# Patient Record
Sex: Female | Born: 1985 | Race: Asian | Hispanic: No | Smoking: Never smoker
Health system: Southern US, Community
[De-identification: ages and names within clinical notes are randomized; demographics above are authoritative.]

## PROBLEM LIST (undated history)

## (undated) ENCOUNTER — Emergency Department (HOSPITAL_COMMUNITY): Admission: EM | Payer: Medicaid Other | Source: Home / Self Care

## (undated) DIAGNOSIS — K219 Gastro-esophageal reflux disease without esophagitis: Secondary | ICD-10-CM

## (undated) HISTORY — DX: Gastro-esophageal reflux disease without esophagitis: K21.9

## (undated) HISTORY — PX: BREAST SURGERY: SHX581

---

## 2017-10-06 DIAGNOSIS — Z3009 Encounter for other general counseling and advice on contraception: Secondary | ICD-10-CM | POA: Diagnosis not present

## 2017-10-06 DIAGNOSIS — Z111 Encounter for screening for respiratory tuberculosis: Secondary | ICD-10-CM | POA: Diagnosis not present

## 2017-10-06 DIAGNOSIS — Z01419 Encounter for gynecological examination (general) (routine) without abnormal findings: Secondary | ICD-10-CM | POA: Diagnosis not present

## 2017-10-06 DIAGNOSIS — Z049 Encounter for examination and observation for unspecified reason: Secondary | ICD-10-CM | POA: Diagnosis not present

## 2017-10-06 DIAGNOSIS — Z23 Encounter for immunization: Secondary | ICD-10-CM | POA: Diagnosis not present

## 2017-10-06 DIAGNOSIS — Z206 Contact with and (suspected) exposure to human immunodeficiency virus [HIV]: Secondary | ICD-10-CM | POA: Diagnosis not present

## 2017-10-06 DIAGNOSIS — Z0389 Encounter for observation for other suspected diseases and conditions ruled out: Secondary | ICD-10-CM | POA: Diagnosis not present

## 2017-10-06 DIAGNOSIS — Z1388 Encounter for screening for disorder due to exposure to contaminants: Secondary | ICD-10-CM | POA: Diagnosis not present

## 2017-10-06 DIAGNOSIS — R87610 Atypical squamous cells of undetermined significance on cytologic smear of cervix (ASC-US): Secondary | ICD-10-CM | POA: Diagnosis not present

## 2017-11-02 ENCOUNTER — Other Ambulatory Visit (HOSPITAL_COMMUNITY): Payer: Self-pay | Admitting: Family

## 2017-11-02 ENCOUNTER — Ambulatory Visit (HOSPITAL_COMMUNITY)
Admission: RE | Admit: 2017-11-02 | Discharge: 2017-11-02 | Disposition: A | Discharge status: Home / Self Care | Payer: Medicaid Other | Source: Ambulatory Visit | Attending: Family | Admitting: Family

## 2017-11-02 DIAGNOSIS — Z975 Presence of (intrauterine) contraceptive device: Secondary | ICD-10-CM

## 2017-11-02 DIAGNOSIS — Z30017 Encounter for initial prescription of implantable subdermal contraceptive: Secondary | ICD-10-CM | POA: Diagnosis not present

## 2017-11-02 DIAGNOSIS — Z3046 Encounter for surveillance of implantable subdermal contraceptive: Secondary | ICD-10-CM | POA: Diagnosis not present

## 2017-12-01 DIAGNOSIS — Z23 Encounter for immunization: Secondary | ICD-10-CM | POA: Diagnosis not present

## 2017-12-13 DIAGNOSIS — H6982 Other specified disorders of Eustachian tube, left ear: Secondary | ICD-10-CM | POA: Diagnosis not present

## 2017-12-13 DIAGNOSIS — H9312 Tinnitus, left ear: Secondary | ICD-10-CM | POA: Diagnosis not present

## 2017-12-13 DIAGNOSIS — Z0110 Encounter for examination of ears and hearing without abnormal findings: Secondary | ICD-10-CM | POA: Diagnosis not present

## 2017-12-13 DIAGNOSIS — H9319 Tinnitus, unspecified ear: Secondary | ICD-10-CM | POA: Diagnosis not present

## 2017-12-16 ENCOUNTER — Other Ambulatory Visit: Payer: Self-pay | Admitting: Otolaryngology

## 2017-12-16 DIAGNOSIS — H9319 Tinnitus, unspecified ear: Secondary | ICD-10-CM

## 2018-01-08 ENCOUNTER — Inpatient Hospital Stay (HOSPITAL_COMMUNITY): Admission: RE | Admit: 2018-01-08 | Payer: Medicaid Other | Source: Ambulatory Visit

## 2018-01-08 ENCOUNTER — Ambulatory Visit
Admission: RE | Admit: 2018-01-08 | Discharge: 2018-01-08 | Disposition: A | Discharge status: Home / Self Care | Payer: Medicaid Other | Source: Ambulatory Visit | Attending: Otolaryngology | Admitting: Otolaryngology

## 2018-01-08 DIAGNOSIS — H9319 Tinnitus, unspecified ear: Secondary | ICD-10-CM

## 2018-11-22 DIAGNOSIS — Z23 Encounter for immunization: Secondary | ICD-10-CM | POA: Diagnosis not present

## 2018-11-30 ENCOUNTER — Other Ambulatory Visit: Payer: Self-pay

## 2018-11-30 DIAGNOSIS — Z20822 Contact with and (suspected) exposure to covid-19: Secondary | ICD-10-CM

## 2018-12-02 LAB — NOVEL CORONAVIRUS, NAA: SARS-CoV-2, NAA: NOT DETECTED

## 2019-07-11 ENCOUNTER — Encounter: Payer: Self-pay | Admitting: *Deleted

## 2019-07-12 NOTE — Congregational Nurse Program (Signed)
  Dept: [place] Nurse Program Note  Date of Encounter: 07/11/2019  Past Medical History: No past medical history on file.  Encounter Details: CNP Questionnaire - 07/11/19 1800      Questionnaire   Patient Status  Refugee    Race  --    Location Patient Served At  Not Applicable   [facility]  --    Uninsured  Uninsured (Subsequent visits/quarter)    Food  No food insecurities    Housing/Utilities  Yes, have permanent housing    Transportation  No transportation needs    Interpersonal Safety  Yes, feel physically and emotionally safe where you currently live    Medication  No medication insecurities    Medical Provider  Yes    ED Visit Averted  Not Applicable    Life-Saving Intervention Made  Not Applicable     See in clinic today for screening.  BP 108/76 P 76.  She last saw MD a year ago and reports that she went to the health department.  She currently takes birth control pills.  She reports headaches associated with the use of birth control pills.  Encouraged to see MD yearly for follow up.  Karene Fry, RN, MSN, [place] Office [phone] Cell

## 2019-12-05 IMAGING — DX DG HUMERUS 2V *R*
2 series · 2 of 2 positions shown · non-contrast
Comparison: None.

CLINICAL DATA: Nexplanon placement.

EXAM:
RIGHT HUMERUS - 2+ VIEW

[w humerus ap right]
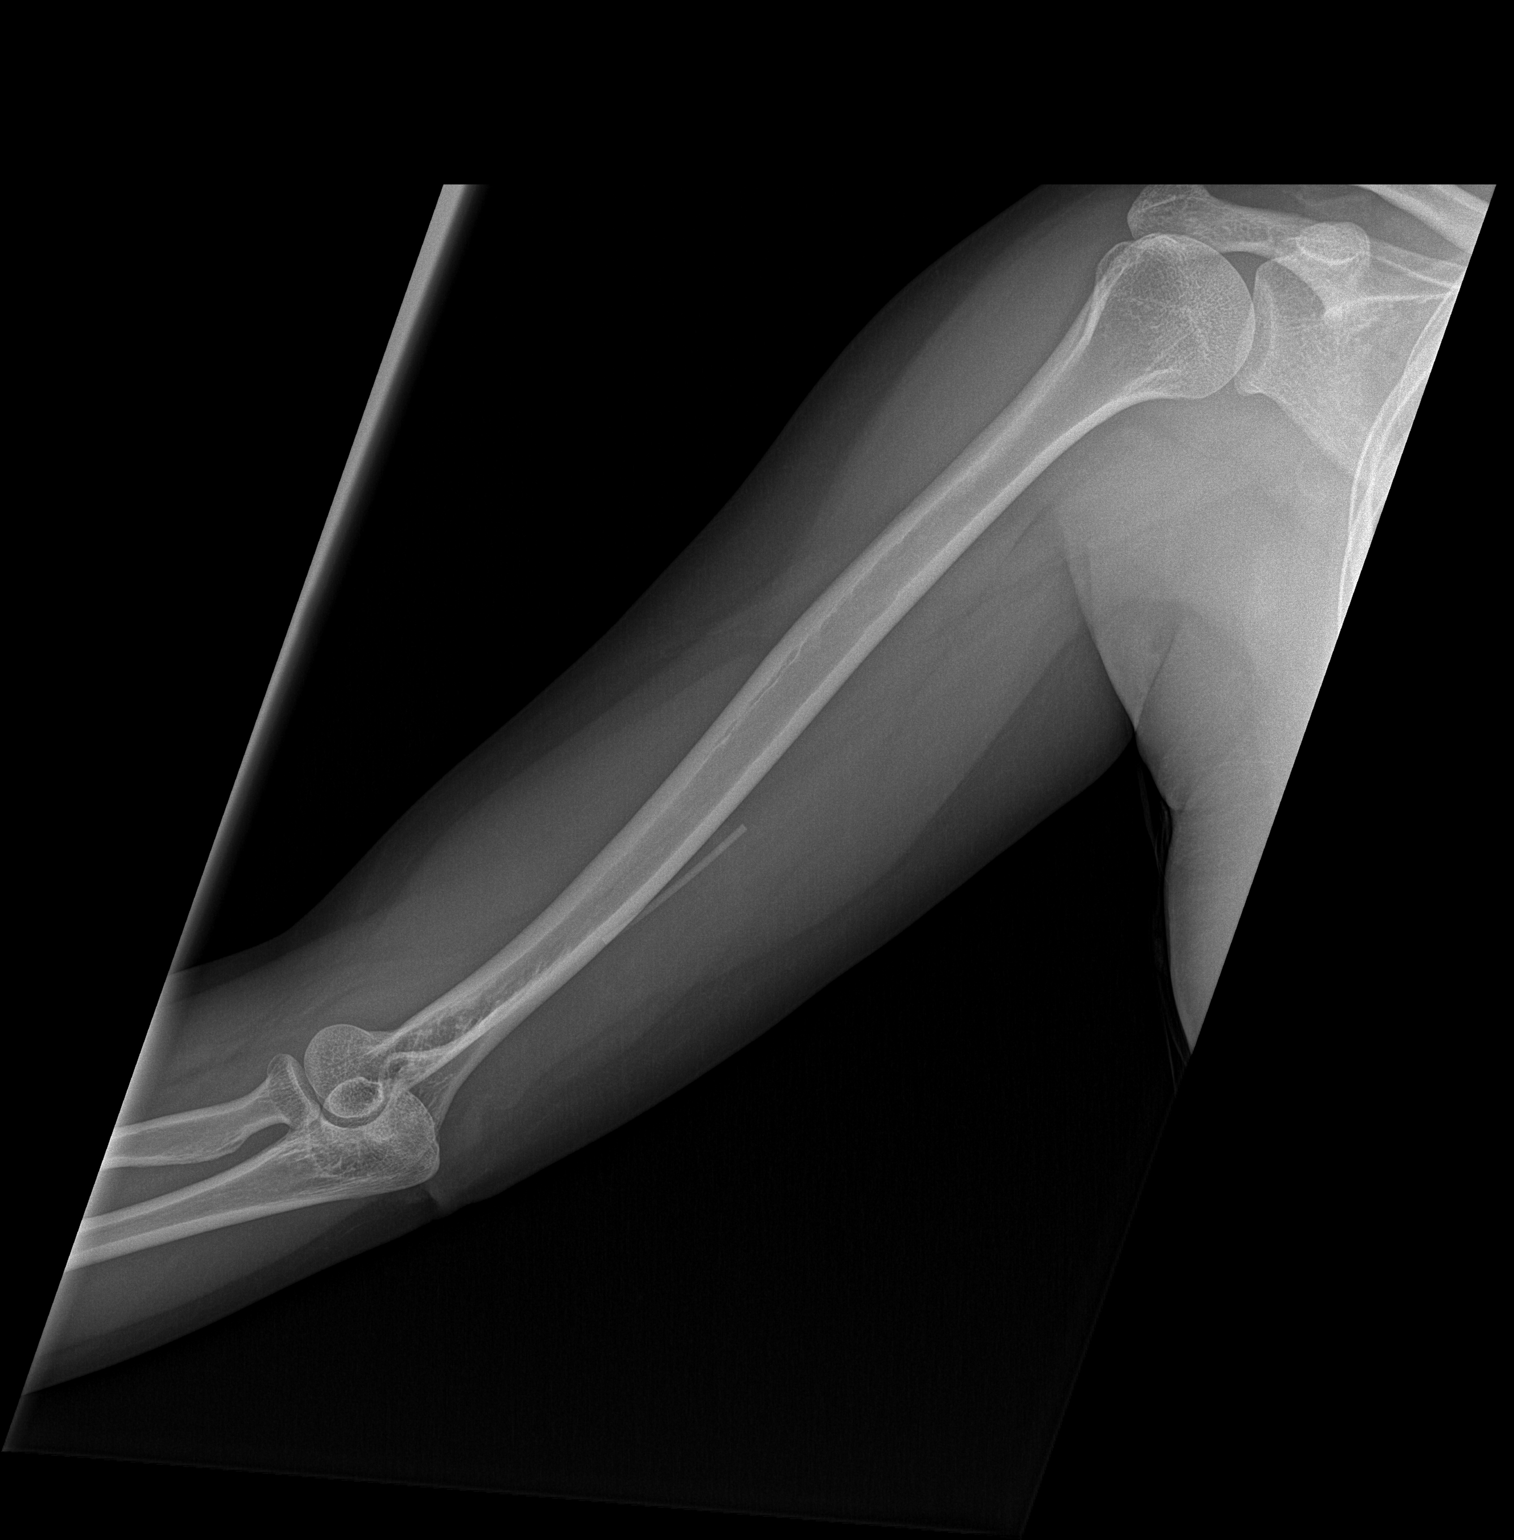

[w humerus lat right]
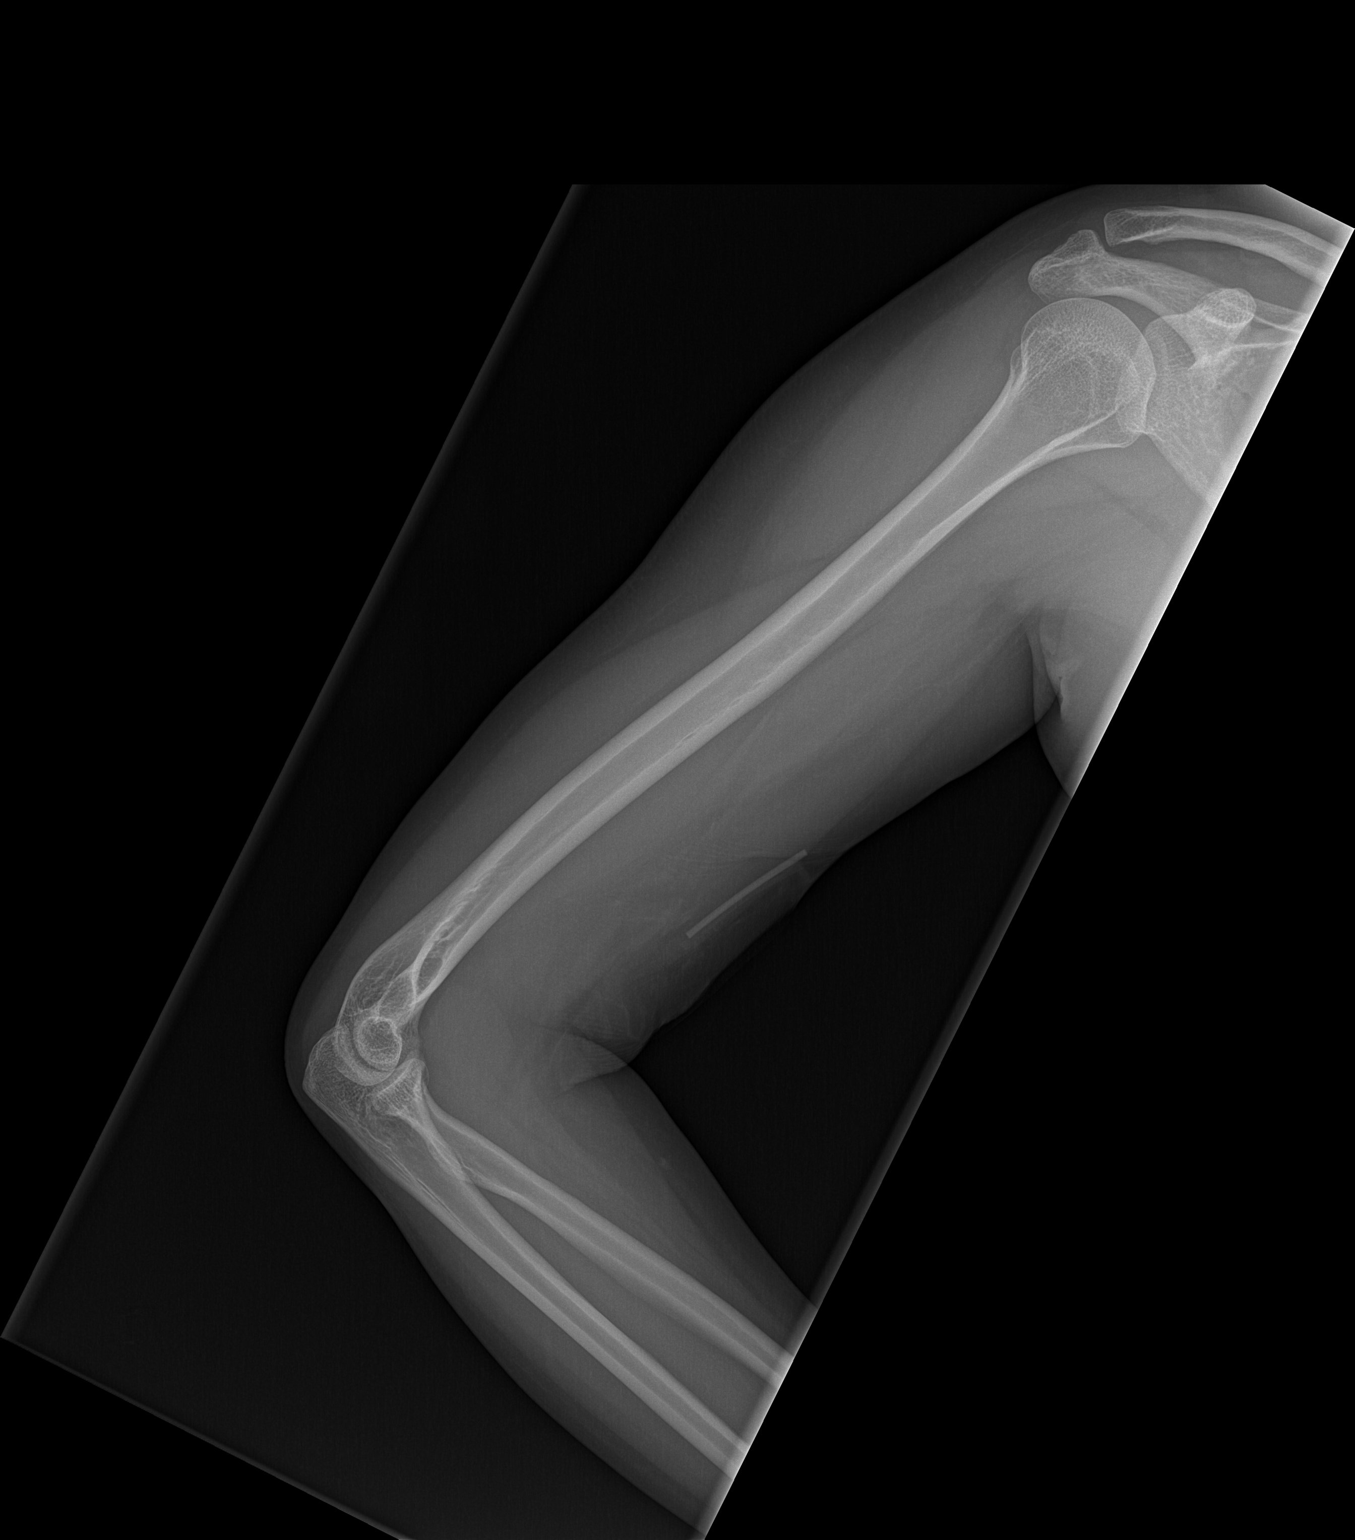

[2 of 2 positions shown; findings below may reference images not displayed]

FINDINGS: There is no evidence of fracture or other focal bone lesions. Linear
implant is seen in soft tissues of mid humeral region.
IMPRESSION: Normal right humerus. Nexplanon implant seen in mid humeral soft
tissues.

## 2020-08-05 DIAGNOSIS — R1013 Epigastric pain: Secondary | ICD-10-CM | POA: Diagnosis not present

## 2020-11-19 ENCOUNTER — Ambulatory Visit: Payer: Medicaid Other | Attending: Physician Assistant | Admitting: Physician Assistant

## 2020-11-19 ENCOUNTER — Encounter: Payer: Self-pay | Admitting: Physician Assistant

## 2020-11-19 ENCOUNTER — Other Ambulatory Visit: Payer: Self-pay

## 2020-11-19 VITALS — BP 112/78 | HR 76 | Temp 98.7°F | Resp 18 | Ht 62.0 in | Wt 123.0 lb

## 2020-11-19 DIAGNOSIS — Z6822 Body mass index (BMI) 22.0-22.9, adult: Secondary | ICD-10-CM

## 2020-11-19 DIAGNOSIS — Z1159 Encounter for screening for other viral diseases: Secondary | ICD-10-CM | POA: Diagnosis not present

## 2020-11-19 DIAGNOSIS — Z975 Presence of (intrauterine) contraceptive device: Secondary | ICD-10-CM | POA: Diagnosis not present

## 2020-11-19 DIAGNOSIS — D1722 Benign lipomatous neoplasm of skin and subcutaneous tissue of left arm: Secondary | ICD-10-CM

## 2020-11-19 DIAGNOSIS — Z13228 Encounter for screening for other metabolic disorders: Secondary | ICD-10-CM

## 2020-11-19 DIAGNOSIS — Z114 Encounter for screening for human immunodeficiency virus [HIV]: Secondary | ICD-10-CM

## 2020-11-19 NOTE — Patient Instructions (Signed)
We will call you with today's lab results.  I encourage you to use some warm compresses on the bump on your left forearm.  Kennieth Rad, PA-C Physician Assistant [email or web address]

## 2020-11-19 NOTE — Progress Notes (Signed)
New Patient Office Visit  Subjective:  Patient ID: Kim Mckinney, female    DOB: Jan 06, 1986  Age: 35 y.o. MRN: [id number]  CC:  Chief Complaint  Patient presents with   Establish Care    HPI Kim Mckinney states that she is new patient and would like to establish care.    States that she has a Nexplanon inserted 11/02/2017 and wishes it to be removed.  States that she has not had any issues with it.  Right upper arm  States that she has a bump on her left forearm, sometimes tender, has been present for several years.  States that she has not tried anything for relief. Denies injury/ trauma  Due to language barrier, an interpreter was present during the history-taking and subsequent discussion (and for part of the physical exam) with this patient.      Past Medical History:  Diagnosis Date   GERD (gastroesophageal reflux disease)     History reviewed. No pertinent surgical history.  History reviewed. No pertinent family history.  Social History   Socioeconomic History   Marital status: Married    Spouse name: Not on file   Number of children: 2   Years of education: Not on file   Highest education level: Not on file  Occupational History   Not on file  Tobacco Use   Smoking status: Never   Smokeless tobacco: Never  Substance and Sexual Activity   Alcohol use: Never   Drug use: Never   Sexual activity: Yes  Other Topics Concern   Not on file  Social History Narrative   Not on file   Social Determinants of Health   Financial Resource Strain: Not on file  Food Insecurity: Not on file  Transportation Needs: Not on file  Physical Activity: Not on file  Stress: Not on file  Social Connections: Not on file  Intimate Partner Violence: Not on file    ROS Review of Systems  Constitutional: Negative.   HENT: Negative.    Eyes: Negative.   Respiratory:  Negative for shortness of breath.   Cardiovascular:  Negative for chest pain.  Gastrointestinal: Negative.    Endocrine: Negative.   Genitourinary: Negative.   Musculoskeletal: Negative.   Skin: Negative.   Allergic/Immunologic: Negative.   Neurological: Negative.   Hematological: Negative.   Psychiatric/Behavioral: Negative.     Objective:   Today's Vitals: BP 112/78 (BP Location: Left Arm, Patient Position: Sitting, Cuff Size: Normal)   Pulse 76   Temp 98.7 F (37.1 C) (Oral)   Resp 18   Ht 5\' 2"  (1.575 m)   Wt 123 lb (55.8 kg)   SpO2 99%   BMI 22.50 kg/m   Physical Exam Vitals and nursing note reviewed.  Constitutional:      Appearance: Normal appearance.  HENT:     Head: Normocephalic and atraumatic.     Right Ear: External ear normal.     Left Ear: External ear normal.     Nose: Nose normal.     Mouth/Throat:     Mouth: Mucous membranes are moist.     Pharynx: Oropharynx is clear.  Eyes:     Extraocular Movements: Extraocular movements intact.     Conjunctiva/sclera: Conjunctivae normal.     Pupils: Pupils are equal, round, and reactive to light.  Cardiovascular:     Rate and Rhythm: Normal rate and regular rhythm.     Pulses: Normal pulses.     Heart sounds: Normal heart sounds.  Pulmonary:  Effort: Pulmonary effort is normal.     Breath sounds: Normal breath sounds.  Musculoskeletal:        General: Normal range of motion.     Cervical back: Normal range of motion and neck supple.  Skin:    General: Skin is warm and dry.  Neurological:     General: No focal deficit present.     Mental Status: She is alert and oriented to person, place, and time.  Psychiatric:        Mood and Affect: Mood normal.        Behavior: Behavior normal.        Thought Content: Thought content normal.        Judgment: Judgment normal.    Assessment & Plan:   Problem List Items Addressed This Visit       Other   Nexplanon in place - Primary   Relevant Orders   Ambulatory referral to Gynecology   Lipoma of left upper extremity   Other Visit Diagnoses     Screening for  metabolic disorder       Relevant Orders   CBC with Differential/Platelet (Completed)   Comp. Metabolic Panel (12) (Completed)   TSH (Completed)   Screening for HIV (human immunodeficiency virus)       Relevant Orders   HIV antibody (with reflex) (Completed)   Encounter for HCV screening test for low risk patient       Relevant Orders   HCV Ab w Reflex to Quant PCR (Completed)   BMI 22.0-22.9, adult           Outpatient Encounter Medications as of 11/19/2020  Medication Sig   omeprazole (PRILOSEC) 20 MG capsule Take 20 mg by mouth daily.   sucralfate (CARAFATE) 1 g tablet Take 1 g by mouth 4 (four) times daily.   No facility-administered encounter medications on file as of 11/19/2020.  1. Nexplanon in place Patient given appointment to establish care with [facility] provider [facility].  Patient given application for [place] financial assistance.  Due to Nexplanon being placed in [place], patient will be referred to gynecology for removal.  Patient was also given information for health department.  Patient understands and agrees. - Ambulatory referral to Gynecology  2. Lipoma of left upper extremity Patient education given on supportive care.  3. Screening for metabolic disorder  - CBC with Differential/Platelet - Comp. Metabolic Panel (12) - TSH  4. Screening for HIV (human immunodeficiency virus)  - HIV antibody (with reflex)  5. Encounter for HCV screening test for low risk patient  - HCV Ab w Reflex to Quant PCR  6. BMI 22.0-22.9, adult    I have reviewed the patient's medical history (PMH, PSH, Social History, Family History, Medications, and allergies) , and have been updated if relevant. I spent 30 minutes reviewing chart and  face to face time with patient.     Follow-up: No follow-ups on file.   Loraine Grip Mayers, PA-C

## 2020-11-19 NOTE — Progress Notes (Signed)
Patient has eaten today. Patient has not taken medication. Patient denies pain at this time.

## 2020-11-20 DIAGNOSIS — D1722 Benign lipomatous neoplasm of skin and subcutaneous tissue of left arm: Secondary | ICD-10-CM | POA: Insufficient documentation

## 2020-11-20 DIAGNOSIS — Z975 Presence of (intrauterine) contraceptive device: Secondary | ICD-10-CM | POA: Insufficient documentation

## 2020-11-20 LAB — CBC WITH DIFFERENTIAL/PLATELET
Basophils Absolute: 0.1 10*3/uL (ref 0.0–0.2)
Basos: 1 %
EOS (ABSOLUTE): 0 10*3/uL (ref 0.0–0.4)
Eos: 0 %
Hematocrit: 44.5 % (ref 34.0–46.6)
Hemoglobin: 14.4 g/dL (ref 11.1–15.9)
Immature Grans (Abs): 0.1 10*3/uL (ref 0.0–0.1)
Immature Granulocytes: 1 %
Lymphocytes Absolute: 1.1 10*3/uL (ref 0.7–3.1)
Lymphs: 15 %
MCH: 29.5 pg (ref 26.6–33.0)
MCHC: 32.4 g/dL (ref 31.5–35.7)
MCV: 91 fL (ref 79–97)
Monocytes Absolute: 0.5 10*3/uL (ref 0.1–0.9)
Monocytes: 7 %
Neutrophils Absolute: 5.6 10*3/uL (ref 1.4–7.0)
Neutrophils: 76 %
Platelets: 254 10*3/uL (ref 150–450)
RBC: 4.88 x10E6/uL (ref 3.77–5.28)
RDW: 11.9 % (ref 11.7–15.4)
WBC: 7.4 10*3/uL (ref 3.4–10.8)

## 2020-11-20 LAB — COMP. METABOLIC PANEL (12)
AST: 20 IU/L (ref 0–40)
Albumin/Globulin Ratio: 1.6 (ref 1.2–2.2)
Albumin: 5.1 g/dL — ABNORMAL HIGH (ref 3.8–4.8)
Alkaline Phosphatase: 72 IU/L (ref 44–121)
BUN/Creatinine Ratio: 17 (ref 9–23)
BUN: 14 mg/dL (ref 6–20)
Bilirubin Total: 0.4 mg/dL (ref 0.0–1.2)
Calcium: 9.6 mg/dL (ref 8.7–10.2)
Chloride: 101 mmol/L (ref 96–106)
Creatinine, Ser: 0.83 mg/dL (ref 0.57–1.00)
Globulin, Total: 3.1 g/dL (ref 1.5–4.5)
Glucose: 96 mg/dL (ref 70–99)
Potassium: 4.2 mmol/L (ref 3.5–5.2)
Sodium: 138 mmol/L (ref 134–144)
Total Protein: 8.2 g/dL (ref 6.0–8.5)
eGFR: 95 mL/min/{1.73_m2} (ref >59)

## 2020-11-20 LAB — HIV ANTIBODY (ROUTINE TESTING W REFLEX): HIV Screen 4th Generation wRfx: NONREACTIVE

## 2020-11-20 LAB — TSH: TSH: 2.66 u[IU]/mL (ref 0.450–4.500)

## 2020-11-20 LAB — HCV AB W REFLEX TO QUANT PCR: HCV Ab: <0.1 s/co ratio (ref 0.0–0.9)

## 2020-11-20 LAB — HCV INTERPRETATION

## 2020-12-05 ENCOUNTER — Telehealth: Payer: Self-pay | Admitting: *Deleted

## 2020-12-05 NOTE — Telephone Encounter (Signed)
-----   Message from Kennieth Rad, [place] sent at 11/20/2020  7:17 PM EDT ----- Please call patient and let her know that her thyroid function, kidney and liver function are within normal limits.  She does not show signs of anemia.  Her screening for hepatitis C and HIV were negative.

## 2020-12-05 NOTE — Telephone Encounter (Signed)
Medical Assistant left message on patient's home and cell voicemail. Voicemail states to give a call back to Caralynn Gelber with [facility] at [phone].  

## 2020-12-09 ENCOUNTER — Telehealth: Payer: Self-pay | Admitting: *Deleted

## 2020-12-09 NOTE — Telephone Encounter (Signed)
-----   Message from Kennieth Rad, [place] sent at 11/20/2020  7:17 PM EDT ----- Please call patient and let her know that her thyroid function, kidney and liver function are within normal limits.  She does not show signs of anemia.  Her screening for hepatitis C and HIV were negative.

## 2020-12-09 NOTE — Telephone Encounter (Signed)
Patient verified DOB Patient is aware of labs being normal and screenings being negative.

## 2021-02-05 ENCOUNTER — Ambulatory Visit (INDEPENDENT_AMBULATORY_CARE_PROVIDER_SITE_OTHER): Payer: Medicaid Other | Admitting: Obstetrics and Gynecology

## 2021-02-05 ENCOUNTER — Other Ambulatory Visit: Payer: Self-pay

## 2021-02-05 ENCOUNTER — Encounter: Payer: Self-pay | Admitting: Obstetrics and Gynecology

## 2021-02-05 VITALS — BP 112/75 | HR 82 | Temp 98.6°F | Wt 120.2 lb

## 2021-02-05 DIAGNOSIS — Z789 Other specified health status: Secondary | ICD-10-CM

## 2021-02-05 DIAGNOSIS — Z3046 Encounter for surveillance of implantable subdermal contraceptive: Secondary | ICD-10-CM

## 2021-02-08 ENCOUNTER — Encounter: Payer: Self-pay | Admitting: Obstetrics and Gynecology

## 2021-02-08 NOTE — Progress Notes (Signed)
[name] is a G2P0 female in the office today for removal of Nexplanon; which was inserted 11/02/2017. She does not desire to have it replaced today. She has no complaints today.  BP 112/75    Pulse 82    Temp 98.6 F (37 C)    Wt 120 lb 3.2 oz (54.5 kg)    BMI 21.98 kg/m    Procedure Note: Consent obtained and Time-Out conducted Implant palpated in left upper arm Betadine prep done on area of excision/removal Lidocaine infiltrated into intradermal and subcutaneous space Small 76mm incision made with scalpel Pressure applied to distal end of implant which exposed the tip through incision Tip of implant grasped with hemostat There was some adherence of implant in subcutaneous tissue. Twisting and manipulation freed the implant from the capsule Two (2) Implants removed >>patient unaware there were 2 implants  Pressure held on incision until bleeding stopped Steristrips applied to incision Pressure dressing applied by RN Patient tolerated procedure well.   Assessment and Plan: 1. Encounter for surveillance of implantable subdermal contraceptive 2. Language barrier affecting health care  - [employer] Audio Burmese Interpreter, attempted for portion of visit; audio had a bad connection, so sister utilized for completion of visit. Ok per patient.  Time spent with patient doing procedure 15 minutes. There was 5 minutes of chart review time spent prior to this encounter. Total time spent = 20 minutes.   Laury Deep, CNM  02/04/2021 3:00 PM

## 2021-09-03 ENCOUNTER — Other Ambulatory Visit: Payer: Self-pay

## 2021-09-03 ENCOUNTER — Ambulatory Visit: Payer: Medicaid Other | Attending: Internal Medicine | Admitting: Internal Medicine

## 2021-09-03 ENCOUNTER — Encounter: Payer: Self-pay | Admitting: Internal Medicine

## 2021-09-03 VITALS — BP 112/78 | HR 80 | Ht 62.0 in | Wt 121.2 lb

## 2021-09-03 DIAGNOSIS — B353 Tinea pedis: Secondary | ICD-10-CM | POA: Insufficient documentation

## 2021-09-03 DIAGNOSIS — K5901 Slow transit constipation: Secondary | ICD-10-CM

## 2021-09-03 DIAGNOSIS — Z7689 Persons encountering health services in other specified circumstances: Secondary | ICD-10-CM | POA: Diagnosis not present

## 2021-09-03 DIAGNOSIS — K219 Gastro-esophageal reflux disease without esophagitis: Secondary | ICD-10-CM

## 2021-09-03 DIAGNOSIS — Z23 Encounter for immunization: Secondary | ICD-10-CM

## 2021-09-03 MED ORDER — POLYETHYLENE GLYCOL 3350 17 G PO PACK: 17.0000 g | PACK | Freq: Every day | ORAL | 1 refills | Status: AC | PRN

## 2021-09-03 MED ORDER — OMEPRAZOLE 20 MG PO CPDR: 20.0000 mg | DELAYED_RELEASE_CAPSULE | Freq: Every day | ORAL | 3 refills | Status: AC

## 2021-09-04 ENCOUNTER — Encounter: Payer: Self-pay | Admitting: Obstetrics and Gynecology

## 2021-10-16 ENCOUNTER — Ambulatory Visit: Payer: Medicaid Other | Admitting: Internal Medicine

## 2022-06-29 ENCOUNTER — Other Ambulatory Visit (HOSPITAL_COMMUNITY)
Admission: RE | Admit: 2022-06-29 | Discharge: 2022-06-29 | Disposition: A | Discharge status: Home / Self Care | Payer: Self-pay | Source: Ambulatory Visit | Attending: Advanced Practice Midwife | Admitting: Advanced Practice Midwife

## 2022-06-29 ENCOUNTER — Encounter: Payer: Self-pay | Admitting: Advanced Practice Midwife

## 2022-06-29 ENCOUNTER — Ambulatory Visit (INDEPENDENT_AMBULATORY_CARE_PROVIDER_SITE_OTHER): Payer: Self-pay | Admitting: Advanced Practice Midwife

## 2022-06-29 VITALS — BP 111/76 | HR 92 | Ht 62.0 in | Wt 120.8 lb

## 2022-06-29 DIAGNOSIS — R300 Dysuria: Secondary | ICD-10-CM

## 2022-06-29 DIAGNOSIS — N898 Other specified noninflammatory disorders of vagina: Secondary | ICD-10-CM

## 2022-06-29 DIAGNOSIS — Z01419 Encounter for gynecological examination (general) (routine) without abnormal findings: Secondary | ICD-10-CM

## 2022-06-29 NOTE — Progress Notes (Signed)
NGYN presents AEX.  Last PAP 5 years ago. Requesting STD testing. Declines BC

## 2022-06-29 NOTE — Progress Notes (Signed)
   Subjective:     Kim Mckinney is a 37 y.o. female here at [facility] for a routine exam.  Current complaints: vaginal discharge, irritation, and dysuria x 2 months.  She desires STD and other infection testing.  Personal health questionnaire reviewed: yes.  Do you have a primary care provider? yes Do you feel safe at home? no  [employer] Visit from 11/19/2020 in [facility]  PHQ-2 Total Score 0       Health Maintenance Due  Topic Date Due   COVID-19 Vaccine (1) Never done   PAP SMEAR-Modifier  Never done     Risk factors for chronic health problems: Smoking: Alchohol/how much: Pt BMI: Body mass index is 22.09 kg/m.   Gynecologic History No LMP recorded. Contraception: none Last Pap: 2019 per pt. Results were: normal Last mammogram: n/a.   Obstetric History OB History  Gravida Para Term Preterm AB Living  2 0 0 0 0 2  SAB IAB Ectopic Multiple Live Births  0 0 0   2    # Outcome Date GA Lbr Len/2nd Weight Sex Delivery Anes PTL Lv  2 Gravida 04/18/11    M Vag-Spont   LIV  1 Gravida 08/19/05    F Vag-Spont   LIV     The following portions of the patient's history were reviewed and updated as appropriate: allergies, current medications, past family history, past medical history, past social history, past surgical history, and problem list.  Review of Systems Pertinent items noted in HPI and remainder of comprehensive ROS otherwise negative.    Objective:  BP 111/76   Pulse 92   Ht 5\' 2"  (1.575 m)   Wt 120 lb 12.8 oz (54.8 kg)   BMI 22.09 kg/m   VS reviewed, nursing note reviewed,  Constitutional: well developed, well nourished, no distress HEENT: normocephalic, thyroid without enlargement or mass HEART: RRR, no murmurs rubs/gallops RESP: clear and equal to auscultation bilaterally in all lobes  Breast Exam: exam performed: right breast normal without mass, skin or nipple changes or axillary nodes, left breast normal  without mass, skin or nipple changes or axillary nodes Abdomen: soft Neuro: alert and oriented x 3 Skin: warm, dry Psych: affect normal Pelvic exam: external evaluation with no erythema, edema or other abnormalities.  SSE deferred. Bimanual exam: Cervix 0/long/high, firm, anterior, neg CMT, uterus nontender, nonenlarged, adnexa without tenderness, enlargement, or mass        Assessment/Plan:   1. Vaginal irritation  - Cervicovaginal ancillary only( [place])  2. Dysuria  - Urine Culture  3. Encounter for annual routine gynecological examination --Doing well overall, due for Pap and with symptoms as listed. --Discussed costs today as pt is self-pay and pt opts to collect cervicovaginal swab and urine testing in office today but will follow up with Pap with [facility] or after receiving [facility] discount.   --Messages sent to set up with [facility] and attempt to assist with financial assistance paperwork with Burmese interpreter.        Return in about 1 year (around 06/29/2023) for annual exam, Pap with [facility] or in our office with [facility] financial assistance.   Sharen Counter, CNM 1:26 PM

## 2022-06-30 LAB — CERVICOVAGINAL ANCILLARY ONLY
Bacterial Vaginitis (gardnerella): NEGATIVE
Candida Glabrata: NEGATIVE
Candida Vaginitis: NEGATIVE
Chlamydia: NEGATIVE
Comment: NEGATIVE
Comment: NEGATIVE
Comment: NEGATIVE
Comment: NEGATIVE
Comment: NEGATIVE
Comment: NORMAL
Neisseria Gonorrhea: NEGATIVE
Trichomonas: NEGATIVE

## 2022-07-02 ENCOUNTER — Telehealth: Payer: Self-pay

## 2022-07-02 NOTE — Telephone Encounter (Signed)
Telephoned patient using interpreter#[id number]. Patient declined cervical screening with [facility] program.

## 2022-07-05 LAB — URINE CULTURE

## 2022-07-06 MED ORDER — SULFAMETHOXAZOLE-TRIMETHOPRIM 800-160 MG PO TABS: 1.0000 | ORAL_TABLET | Freq: Two times a day (BID) | ORAL | 0 refills | Status: AC

## 2022-07-06 NOTE — Addendum Note (Signed)
Addended by: Sharen Counter A on: 07/06/2022 10:15 AM   Modules accepted: Orders

## 2022-07-08 NOTE — Telephone Encounter (Signed)
Telephoned patient at mobile and home number using interpreter. Left a voice message with [facility] scheduling contact information.

## 2024-01-16 ENCOUNTER — Other Ambulatory Visit (HOSPITAL_COMMUNITY)
Admission: RE | Admit: 2024-01-16 | Discharge: 2024-01-16 | Disposition: A | Discharge status: Home / Self Care | Payer: Self-pay | Source: Ambulatory Visit | Attending: Internal Medicine | Admitting: Internal Medicine

## 2024-01-16 ENCOUNTER — Ambulatory Visit: Payer: Self-pay | Attending: Internal Medicine | Admitting: Internal Medicine

## 2024-01-16 ENCOUNTER — Encounter: Payer: Self-pay | Admitting: Internal Medicine

## 2024-01-16 VITALS — BP 97/64 | HR 75 | Temp 98.2°F | Ht 62.0 in | Wt 122.0 lb

## 2024-01-16 DIAGNOSIS — R079 Chest pain, unspecified: Secondary | ICD-10-CM

## 2024-01-16 DIAGNOSIS — H547 Unspecified visual loss: Secondary | ICD-10-CM

## 2024-01-16 DIAGNOSIS — Z0000 Encounter for general adult medical examination without abnormal findings: Secondary | ICD-10-CM

## 2024-01-16 DIAGNOSIS — Z124 Encounter for screening for malignant neoplasm of cervix: Secondary | ICD-10-CM

## 2024-01-16 DIAGNOSIS — Z2821 Immunization not carried out because of patient refusal: Secondary | ICD-10-CM

## 2024-01-16 DIAGNOSIS — G8929 Other chronic pain: Secondary | ICD-10-CM

## 2024-01-16 NOTE — Progress Notes (Signed)
 Patient ID: Kim Mckinney, female    DOB: 1985/11/19  MRN: [id number]  CC: Annual Exam (Physical. /No questions / concerns/No to flu vax)   Subjective: Kim Mckinney is a 38 y.o. female who presents for physical. Her concerns today include:  Pt with hx of GERD, constipation, tinea pedis  [employer] Language interpreter used during this encounter. #[id number], [id number]   Discussed the use of AI scribe software for clinical note transcription with the patient, who gave verbal consent to proceed.  History of Present Illness Kim Mckinney is a 38 year old female who presents for an annual physical exam.  She experiences chest pain three months ago, particularly when feeling tired at work.   Reports BL knee pain. The pain occurs almost daily and has been present for about six to seven months, worsening after sitting for a while and then getting up. Wprks 10 hrs 5 days a wk and 8 hrs on Saturdays. Work is sedentary working on a dealer.  She reports no problems with hearing, although she feels her hearing is not as sharp as before, which her husband sometimes notices.   She has difficulty seeing clearly when driving at night and has not had a recent eye exam. She does not have routine dental cleanings and has not seen a dentist since moving to the area.  Her diet includes daily vegetables and meat, and she drinks one cup of coffee daily. She does not consume sugary drinks or sodas. She does not engage in regular exercise due to fatigue from work and household responsibilities.  GYN History:  Pt is G2P2 Any hx of abn paps?: no Menses regular or irregular?: yes  How long does menses last? 3 days Menstrual flow light or heavy?: dark and clotting but light Method of birth control?:  no. Last cycle 12/19/23 Any vaginal dischg at this time?: sometimes has white itchy vaginal dischg but not currently. Dysuria?: no Any hx of STI?: no Sexually active with how many partners: her husband only Desires STI screen:  Last  MMG: NA Family hx of uterine, cervical or breast cancer?:     Patient Active Problem List   Diagnosis Date Noted   Gastroesophageal reflux disease without esophagitis 09/03/2021   Slow transit constipation 09/03/2021   Tinea pedis of both feet 09/03/2021   Lipoma of left upper extremity 11/20/2020     Current Outpatient Medications on File Prior to Visit  Medication Sig Dispense Refill   omeprazole  (PRILOSEC) 20 MG capsule Take 20 mg by mouth daily. (Patient not taking: Reported on 01/16/2024)     omeprazole  (PRILOSEC) 20 MG capsule Take 1 capsule (20 mg total) by mouth daily. (Patient not taking: Reported on 01/16/2024) 30 capsule 3   polyethylene glycol (MIRALAX ) 17 g packet Take 17 g by mouth daily as needed. (Patient not taking: Reported on 01/16/2024) 30 each 1   sucralfate (CARAFATE) 1 g tablet Take 1 g by mouth 4 (four) times daily. (Patient not taking: Reported on 01/16/2024)     No current facility-administered medications on file prior to visit.    No Known Allergies  Social History   Socioeconomic History   Marital status: Significant Other    Spouse name: Not on file   Number of children: 2   Years of education: Not on file   Highest education level: 7th grade  Occupational History   Occupation: Semstress  Tobacco Use   Smoking status: Never    Passive exposure: Never   Smokeless  tobacco: Never  Vaping Use   Vaping status: Never Used  Substance and Sexual Activity   Alcohol use: Not Currently   Drug use: Never   Sexual activity: Yes    Birth control/protection: None    Comment: Nexplanon  removed 02/05/2021  Other Topics Concern   Not on file  Social History Narrative   ** Merged History Encounter **       Social Drivers of Corporate Investment Banker Strain: Not on file  Food Insecurity: Not on file  Transportation Needs: Not on file  Physical Activity: Not on file  Stress: Not on file  Social Connections: Not on file  Intimate Partner Violence: Not  on file    No family history on file.  Past Surgical History:  Procedure Laterality Date   BREAST SURGERY      ROS: Review of Systems  HENT:  Negative for congestion and trouble swallowing.   Respiratory:  Negative for cough and shortness of breath.   Cardiovascular:  Negative for leg swelling.  Gastrointestinal:  Negative for abdominal pain and blood in stool.  Genitourinary:  Negative for difficulty urinating.   Negative except as stated above  PHYSICAL EXAM: BP 97/64 (BP Location: Left Arm, Patient Position: Sitting, Cuff Size: Normal)   Pulse 75   Temp 98.2 F (36.8 C) (Oral)   Ht 5' 2 (1.575 m)   Wt 122 lb (55.3 kg)   SpO2 98%   BMI 22.31 kg/m   Wt Readings from Last 3 Encounters:  01/16/24 122 lb (55.3 kg)  06/29/22 120 lb 12.8 oz (54.8 kg)  09/03/21 121 lb 3.2 oz (55 kg)    Physical Exam  General appearance - alert, well appearing, and in no distress Mental status - normal mood, behavior, speech, dress, motor activity, and thought processes Eyes - pupils equal and reactive, extraocular eye movements intact Ears - bilateral TM's and external ear canals normal Nose - normal and patent, no erythema, discharge or polyps Mouth - mucous membranes moist, pharynx normal without lesions; teeth stained brown Neck - supple, no significant adenopathy Lymphatics - no palpable lymphadenopathy, no hepatosplenomegaly Chest - clear to auscultation, no wheezes, rales or rhonchi, symmetric air entry Heart - normal rate, regular rhythm, normal S1, S2, no murmurs, rubs, clicks or gallops Abdomen - soft, nontender, nondistended, no masses or organomegaly Breasts - breasts appear normal, no suspicious masses, no skin or nipple changes or axillary nodes Pelvic - normal external genitalia, vulva, vagina, cervix, uterus and adnexa Musculoskeletal - knees: Slight joint enlargement.  She has good range of motion with mild crepitus on passive range of motion of the right  knee. Extremities -no lower extremity edema Skin - normal coloration and turgor, no rashes, no suspicious skin lesions noted     01/16/2024   11:53 AM 11/19/2020    2:04 PM  Depression screen PHQ 2/9  Decreased Interest 0 0  Down, Depressed, Hopeless 2 0  PHQ - 2 Score 2 0  Altered sleeping 0   Tired, decreased energy 3   Change in appetite 0   Feeling bad or failure about yourself  0   Trouble concentrating 0   Moving slowly or fidgety/restless 0   Suicidal thoughts 0   PHQ-9 Score 5   Difficult doing work/chores Not difficult at all        Latest Ref Rng & Units 11/19/2020    2:58 PM  CMP  Glucose 70 - 99 mg/dL 96   BUN 6 -  20 mg/dL 14   Creatinine 9.42 - 1.00 mg/dL 9.16   Sodium 865 - 855 mmol/L 138   Potassium 3.5 - 5.2 mmol/L 4.2   Chloride 96 - 106 mmol/L 101   Calcium 8.7 - 10.2 mg/dL 9.6   Total Protein 6.0 - 8.5 g/dL 8.2   Total Bilirubin 0.0 - 1.2 mg/dL 0.4   Alkaline Phos 44 - 121 IU/L 72   AST 0 - 40 IU/L 20    Lipid Panel  No results found for: CHOL, TRIG, HDL, CHOLHDL, VLDL, LDLCALC, LDLDIRECT  CBC    Component Value Date/Time   WBC 7.4 11/19/2020 1458   RBC 4.88 11/19/2020 1458   HGB 14.4 11/19/2020 1458   HCT 44.5 11/19/2020 1458   PLT 254 11/19/2020 1458   MCV 91 11/19/2020 1458   MCH 29.5 11/19/2020 1458   MCHC 32.4 11/19/2020 1458   RDW 11.9 11/19/2020 1458   LYMPHSABS 1.1 11/19/2020 1458   EOSABS 0.0 11/19/2020 1458   BASOSABS 0.1 11/19/2020 1458    ASSESSMENT AND PLAN: 1. Annual physical exam (Primary) Routine wellness visit focused on health maintenance and lifestyle modifications. - Performed Pap smear for cervical cancer screening. - Ordered baseline blood tests: blood count, kidney function, liver function, cholesterol. - Encouraged healthy eating: reduce sugary drinks, portion sizes of white carbohydrates; increase fresh fruits, vegetables, lean meats. - Advised daily exercise: park further away, take stairs. -  Encouraged routine dental cleanings twice a year.  - CBC - Comprehensive metabolic panel with GFR - Lipid panel  2. Pap smear for cervical cancer screening - Cytology - PAP([place])  3. Poor vision Encouraged her to get an eye exam done.  Probably most cost-effective place to have 1 done [facility] at [facility].  4. Chronic pain of both knees Could be early arthritis. Recommend using Tylenol over-the-counter as needed.  5. Chest pain, unspecified type Let us  know if she has any further episodes  6. Influenza vaccination declined  Patient was given the opportunity to ask questions.  Patient verbalized understanding of the plan and was able to repeat key elements of the plan.   This documentation was completed using Paediatric nurse.  Any transcriptional errors are unintentional.  Orders Placed This Encounter  Procedures   CBC   Comprehensive metabolic panel with GFR   Lipid panel     Requested Prescriptions    No prescriptions requested or ordered in this encounter    Return if symptoms worsen or fail to improve.  Barnie Louder, MD, FACP

## 2024-01-16 NOTE — Patient Instructions (Signed)
  VISIT SUMMARY: You came in today for your annual physical exam. We discussed your overall health, including your menstrual cycle, chest pain, and some issues with vision and hearing. We also talked about your diet, exercise habits, and the importance of routine dental cleanings.  YOUR PLAN: -WOMAN'S WELLNESS VISIT: This visit focused on maintaining your overall health and making lifestyle changes. We performed a Pap smear to screen for cervical cancer and ordered blood tests to check your blood count, kidney function, liver function, and cholesterol levels. You were advised to eat healthily by reducing sugary drinks and portion sizes of white carbohydrates, and increasing fresh fruits, vegetables, and lean meats. Daily exercise was encouraged, such as parking further away and taking the stairs. Routine dental cleanings twice a year were also recommended.  -CHRONIC BILATERAL KNEE PAIN: Your knee pain is likely due to prolonged sitting at work. You were advised to take over-the-counter Tylenol for pain relief, take breaks to move around during work, and bend your legs intermittently while sitting.  -CHEST PAIN: You have been experiencing intermittent chest pain for the past 6-7 months, which worsens with fatigue and prolonged sitting. You were advised to monitor the chest pain and report any further episodes.  -VISUAL IMPAIRMENT: You have difficulty seeing clearly at night while driving. You were recommended to get an eye exam and suggested [facility] for cost-effective options.  INSTRUCTIONS: Please follow up with the recommended blood tests and schedule an eye exam. Monitor your chest pain and report any further episodes. Make sure to take breaks and move around during work to help with your knee pain. Schedule routine dental cleanings twice a year.                      Contains text generated by [employer].                                  Contains text generated by [employer].

## 2024-01-17 ENCOUNTER — Ambulatory Visit: Payer: Self-pay | Admitting: Internal Medicine

## 2024-01-17 LAB — COMPREHENSIVE METABOLIC PANEL WITH GFR
ALT: 13 IU/L (ref 0–32)
AST: 20 IU/L (ref 0–40)
Albumin: 4.5 g/dL (ref 3.9–4.9)
Alkaline Phosphatase: 59 IU/L (ref 41–116)
BUN/Creatinine Ratio: 26 — ABNORMAL HIGH (ref 9–23)
BUN: 17 mg/dL (ref 6–20)
Bilirubin Total: 0.5 mg/dL (ref 0.0–1.2)
CO2: 24 mmol/L (ref 20–29)
Calcium: 9.5 mg/dL (ref 8.7–10.2)
Chloride: 102 mmol/L (ref 96–106)
Creatinine, Ser: 0.65 mg/dL (ref 0.57–1.00)
Globulin, Total: 3.2 g/dL (ref 1.5–4.5)
Glucose: 104 mg/dL — ABNORMAL HIGH (ref 70–99)
Potassium: 4.2 mmol/L (ref 3.5–5.2)
Sodium: 137 mmol/L (ref 134–144)
Total Protein: 7.7 g/dL (ref 6.0–8.5)
eGFR: 116 mL/min/1.73 (ref >59)

## 2024-01-17 LAB — CBC
Hematocrit: 40.6 % (ref 34.0–46.6)
Hemoglobin: 13.1 g/dL (ref 11.1–15.9)
MCH: 29.7 pg (ref 26.6–33.0)
MCHC: 32.3 g/dL (ref 31.5–35.7)
MCV: 92 fL (ref 79–97)
Platelets: 244 x10E3/uL (ref 150–450)
RBC: 4.41 x10E6/uL (ref 3.77–5.28)
RDW: 11.8 % (ref 11.7–15.4)
WBC: 6.3 x10E3/uL (ref 3.4–10.8)

## 2024-01-17 LAB — LIPID PANEL
Chol/HDL Ratio: 2.9 ratio (ref 0.0–4.4)
Cholesterol, Total: 165 mg/dL (ref 100–199)
HDL: 57 mg/dL (ref >39)
LDL Chol Calc (NIH): 92 mg/dL (ref 0–99)
Triglycerides: 88 mg/dL (ref 0–149)
VLDL Cholesterol Cal: 16 mg/dL (ref 5–40)

## 2024-01-18 LAB — CYTOLOGY - PAP
Comment: NEGATIVE
Diagnosis: NEGATIVE
Diagnosis: REACTIVE
High risk HPV: NEGATIVE
